# Patient Record
Sex: Female | Born: 1980 | Race: White | Hispanic: No | Smoking: Never smoker
Health system: Southern US, Community
[De-identification: ages and names within clinical notes are randomized; demographics above are authoritative.]

---

## 2016-09-26 ENCOUNTER — Emergency Department (INDEPENDENT_AMBULATORY_CARE_PROVIDER_SITE_OTHER)
Admission: EM | Admit: 2016-09-26 | Discharge: 2016-09-26 | Disposition: A | Discharge status: Home / Self Care | Payer: BLUE CROSS/BLUE SHIELD | Source: Home / Self Care | Attending: Family Medicine | Admitting: Family Medicine

## 2016-09-26 ENCOUNTER — Encounter: Payer: Self-pay | Admitting: Emergency Medicine

## 2016-09-26 DIAGNOSIS — S01112A Laceration without foreign body of left eyelid and periocular area, initial encounter: Secondary | ICD-10-CM

## 2016-09-26 DIAGNOSIS — Z23 Encounter for immunization: Secondary | ICD-10-CM

## 2016-09-26 MED ORDER — TETANUS-DIPHTH-ACELL PERTUSSIS 5-2.5-18.5 LF-MCG/0.5 IM SUSP
0.5000 mL | Freq: Once | INTRAMUSCULAR | Status: AC
  Administered 2016-09-26: 0.5 mL via INTRAMUSCULAR
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
## 2020-02-28 ENCOUNTER — Other Ambulatory Visit: Payer: Self-pay

## 2020-02-28 ENCOUNTER — Other Ambulatory Visit: Payer: Self-pay | Admitting: Physician Assistant

## 2020-02-28 DIAGNOSIS — N63 Unspecified lump in unspecified breast: Secondary | ICD-10-CM

## 2020-03-27 ENCOUNTER — Ambulatory Visit
Admission: RE | Admit: 2020-03-27 | Discharge: 2020-03-27 | Disposition: A | Discharge status: Home / Self Care | Payer: BC Managed Care – PPO | Source: Ambulatory Visit | Attending: Physician Assistant | Admitting: Physician Assistant

## 2020-03-27 ENCOUNTER — Other Ambulatory Visit: Payer: Self-pay

## 2020-03-27 DIAGNOSIS — N63 Unspecified lump in unspecified breast: Secondary | ICD-10-CM

## 2020-04-16 ENCOUNTER — Other Ambulatory Visit: Payer: Self-pay

## 2022-05-05 IMAGING — MG DIGITAL DIAGNOSTIC BILAT W/ TOMO W/ CAD
6 of 10 series · 6 of 30 positions shown · non-contrast
Comparison: Previous exam(s).

CLINICAL DATA: 39-year-old female presenting for evaluation of a
diffuse right breast pain. On clinical breast exam, her clinician
palpated a possible tender palpable lump in the right axilla.

EXAM:
DIGITAL DIAGNOSTIC BILATERAL MAMMOGRAM WITH TOMOSYNTHESIS AND CAD;
ULTRASOUND RIGHT BREAST LIMITED
TECHNIQUE: Bilateral digital diagnostic mammography and breast tomosynthesis
was performed. The images were evaluated with computer-aided
detection.; Targeted ultrasound examination of the right breast was
performed

[R TAN synth-2D]
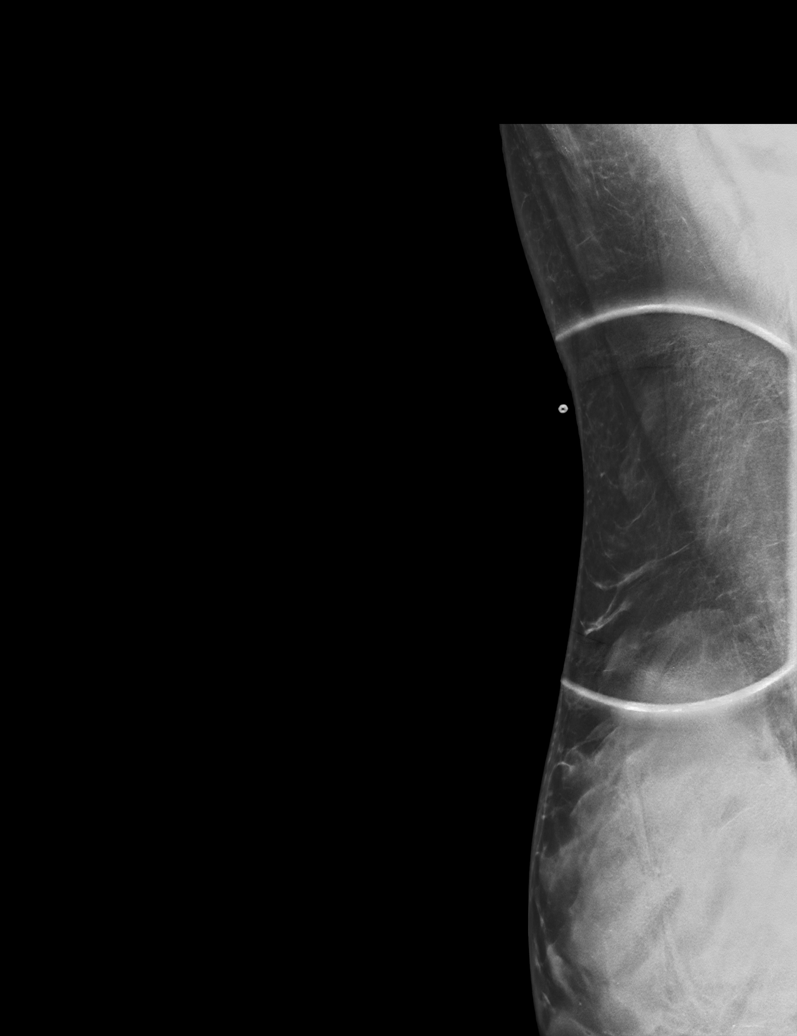

[R MLO synth-2D]
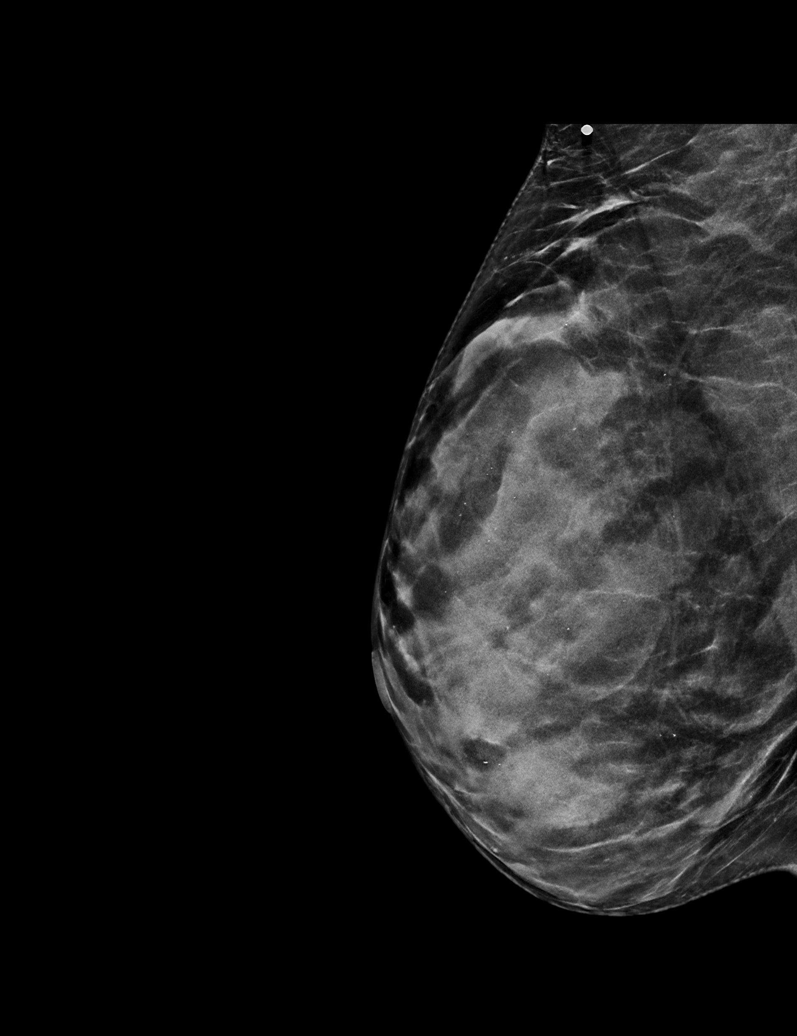

[L MLO synth-2D]
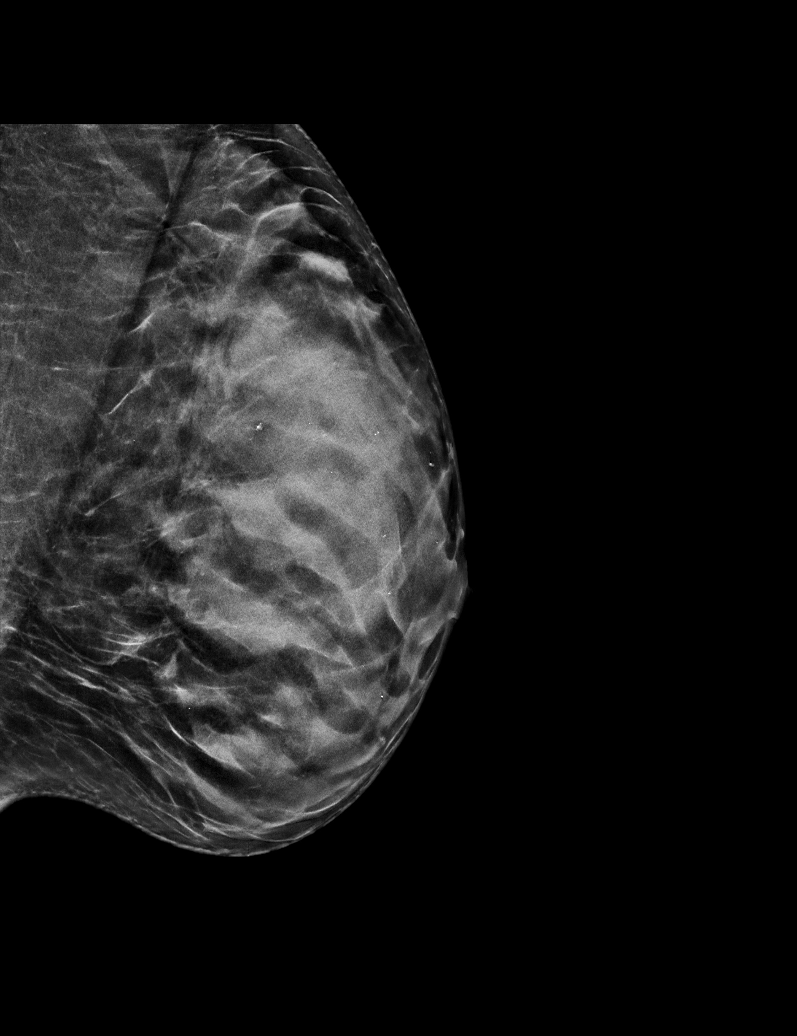

[R CC synth-2D]
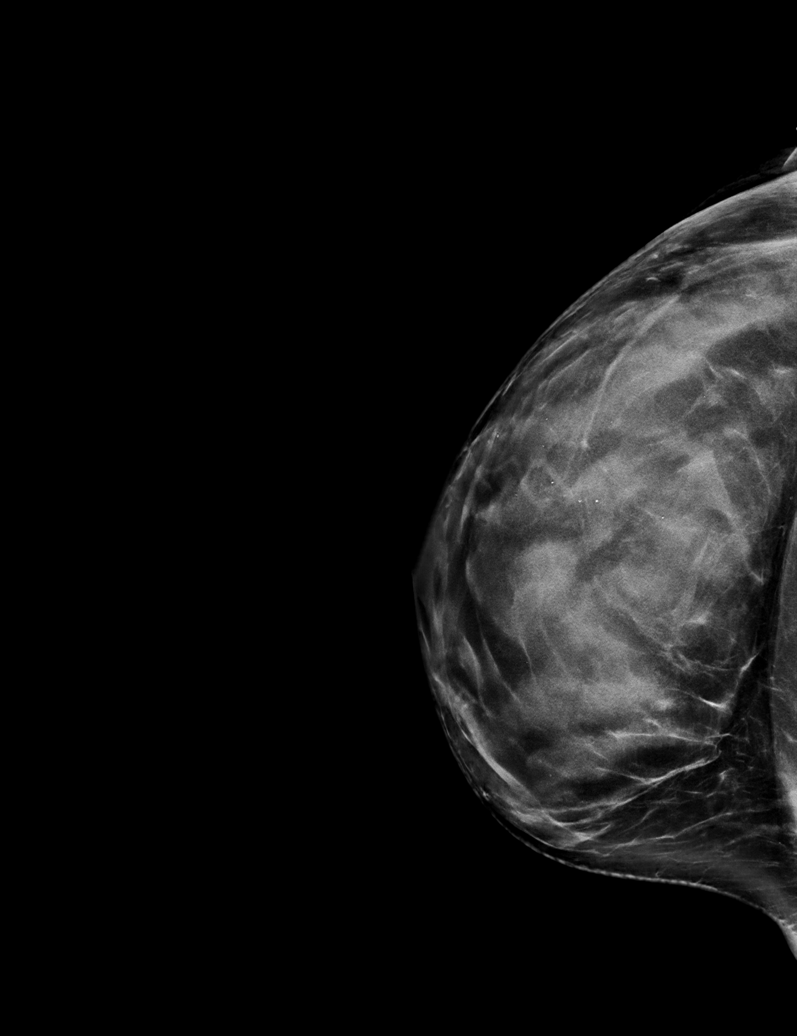

[L CC synth-2D]
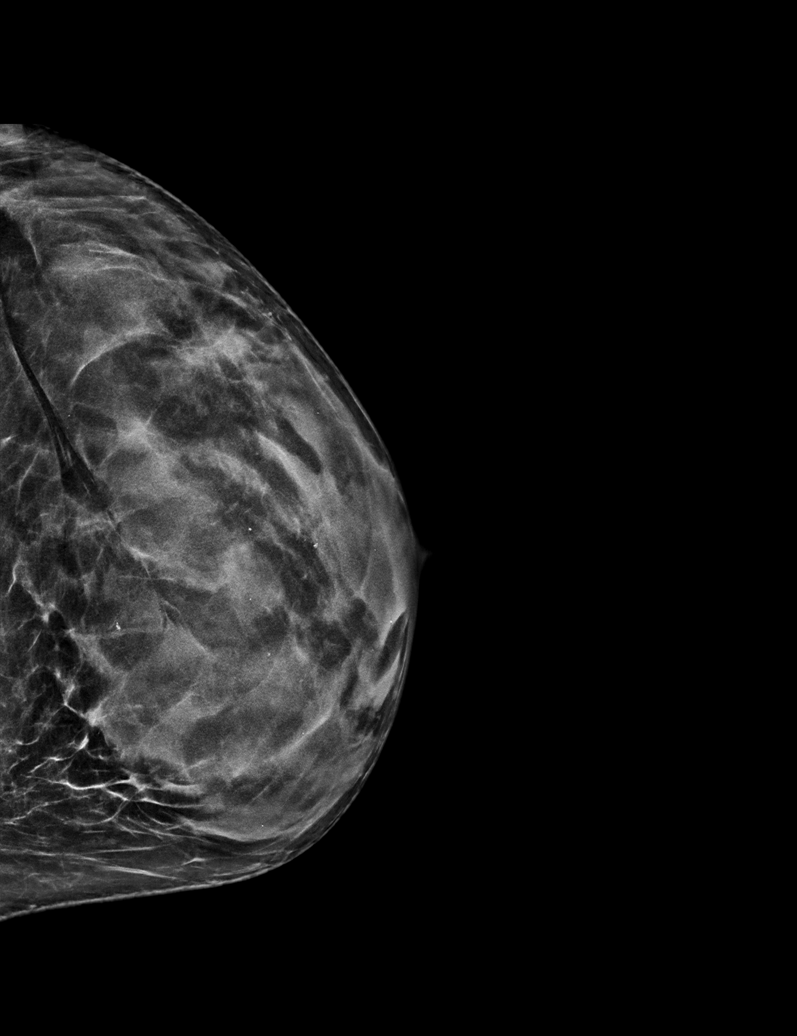

[R MLO tomo · tomo slice 30/59.0]
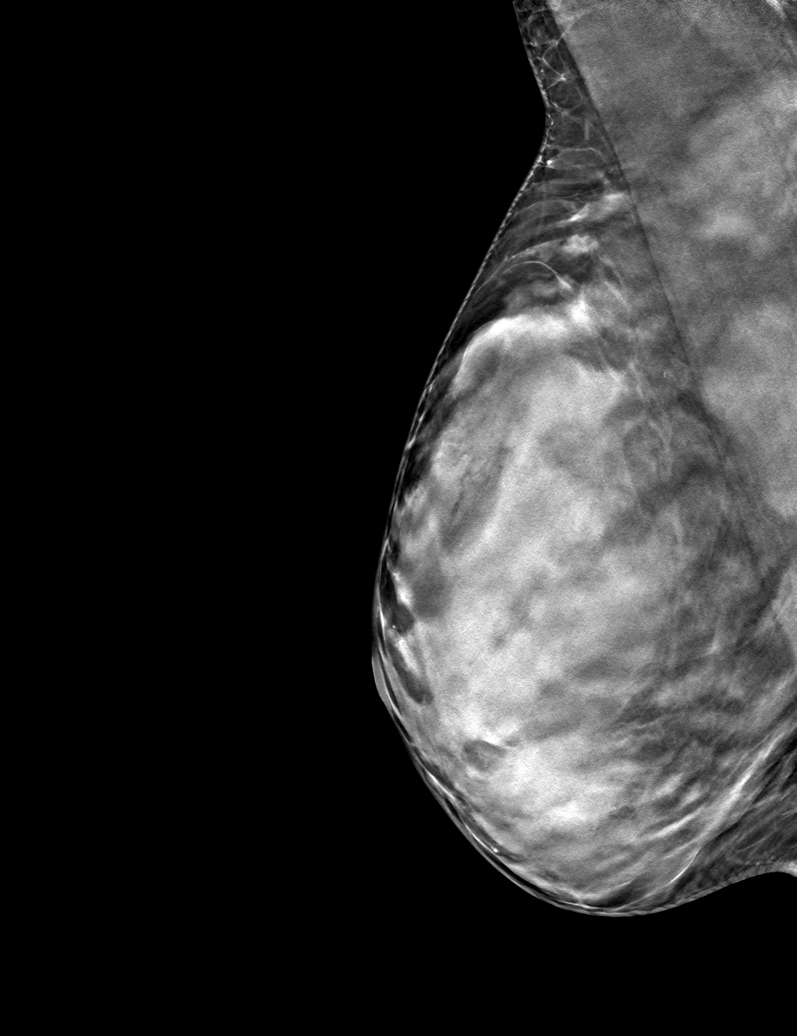

[6 of 30 positions shown; findings below may reference images not displayed]

ACR Breast Density Category d: The breast tissue is extremely dense,
which lowers the sensitivity of mammography.
FINDINGS: A BB indicating the palpable site of concern has been placed over
the right axilla. Deep to this marker on the MLO image there is a
partially visualized asymmetry spanning 3.6 cm. There is a second
asymmetry just inferior to this, overlying the pectoralis
musculature measuring 4.5 cm. No other suspicious calcifications,
masses or areas of distortion are seen in the bilateral breasts.

Physical exam of the lateral aspect of the right breast demonstrates
dense lumpy fibroglandular tissue.

Ultrasound targeted to the right axilla and far lateral right breast
demonstrates lobulated islands of dense fibroglandular tissue. No
suspicious masses or areas of shadowing are identified.
IMPRESSION: 1. There are no suspicious abnormalities in the right axilla or
lateral aspect of the right breast. The patient does have accessory
breast tissue, which could account for the palpable site of concern.

2.  No mammographic evidence of malignancy in the bilateral breasts.

RECOMMENDATION:
1. Clinical follow-up recommended for the palpable area of concern
in the right breast. Any further workup should be based on clinical
grounds.

2. Breast pain is a common condition, which will often resolve on
its own without intervention. It can be affected by hormonal
changes, medication side effect, weight changes and fit of the bra.
Pain may also be referred from other adjacent areas of the body.
Breast pain may be improved by wearing adequate well-fitting
support, over-the-counter topical and oral NSAID medication, low-fat
diet, and ice/heat as needed. Studies have shown an improvement in
cyclic pain with use of evening primrose oil and vitamin E. Clinical
follow-up recommended to discuss any further work-up recommendations
and appropriate treatment.

3. Screening mammogram at age 40 unless there are persistent or
intervening clinical concerns. (Code:[id number])

I have discussed the findings and recommendations with the patient.
If applicable, a reminder letter will be sent to the patient
regarding the next appointment.

BI-RADS CATEGORY  1: Negative.
